# Patient Record
Sex: Female | Born: 1970
Health system: Midwestern US, Community
[De-identification: ages and names within clinical notes are randomized; demographics above are authoritative.]

## PROBLEM LIST (undated history)

## (undated) DIAGNOSIS — M545 Low back pain, unspecified: Secondary | ICD-10-CM

## (undated) DIAGNOSIS — Z1231 Encounter for screening mammogram for malignant neoplasm of breast: Secondary | ICD-10-CM

## (undated) DIAGNOSIS — E049 Nontoxic goiter, unspecified: Secondary | ICD-10-CM

## (undated) DIAGNOSIS — K449 Diaphragmatic hernia without obstruction or gangrene: Secondary | ICD-10-CM

## (undated) DIAGNOSIS — G2581 Restless legs syndrome: Secondary | ICD-10-CM

## (undated) DIAGNOSIS — K5190 Ulcerative colitis, unspecified, without complications: Secondary | ICD-10-CM

## (undated) DIAGNOSIS — D649 Anemia, unspecified: Secondary | ICD-10-CM

## (undated) DIAGNOSIS — G4733 Obstructive sleep apnea (adult) (pediatric): Secondary | ICD-10-CM

## (undated) DIAGNOSIS — K219 Gastro-esophageal reflux disease without esophagitis: Secondary | ICD-10-CM

## (undated) DIAGNOSIS — J45909 Unspecified asthma, uncomplicated: Secondary | ICD-10-CM

## (undated) HISTORY — DX: Restless legs syndrome: G25.81

## (undated) HISTORY — DX: Ulcerative colitis, unspecified, without complications: K51.90

## (undated) HISTORY — DX: Diaphragmatic hernia without obstruction or gangrene: K44.9

## (undated) HISTORY — DX: Anemia, unspecified: D64.9

## (undated) HISTORY — DX: Gastro-esophageal reflux disease without esophagitis: K21.9

## (undated) HISTORY — DX: Unspecified asthma, uncomplicated: J45.909

## (undated) HISTORY — DX: Obstructive sleep apnea (adult) (pediatric): G47.33

---

## 1995-06-11 HISTORY — PX: TOTAL COLECTOMY: SHX852

## 1995-06-11 HISTORY — PX: ILEOSTOMY CLOSURE: SHX1784

## 2015-01-20 NOTE — Progress Notes (Signed)
Formatting of this note might be different from the original.  Returns doing well. Very pleased with results following examination under anesthesia dilatation of anal sphincters. Bowels moving regularly for her which is about 5 or 6 times per 24 hours. No significant pain. Examination anal canal all bit stenotic but seemingly with adequate outlet. Follow up with [place] as needed.  Electronically signed by Golda Acre, MD at 01/20/2015  7:25 PM EDT

## 2015-07-14 ENCOUNTER — Other Ambulatory Visit: Payer: Self-pay | Admitting: Family Medicine

## 2015-07-14 DIAGNOSIS — R1013 Epigastric pain: Secondary | ICD-10-CM

## 2015-07-24 ENCOUNTER — Ambulatory Visit
Admission: RE | Admit: 2015-07-24 | Discharge: 2015-07-24 | Disposition: A | Discharge status: Home / Self Care | Payer: BLUE CROSS/BLUE SHIELD | Source: Ambulatory Visit | Attending: Family Medicine | Admitting: Family Medicine

## 2015-07-24 DIAGNOSIS — R1013 Epigastric pain: Secondary | ICD-10-CM

## 2015-09-01 ENCOUNTER — Other Ambulatory Visit (HOSPITAL_COMMUNITY): Payer: Self-pay | Admitting: Physician Assistant

## 2015-09-01 DIAGNOSIS — R1011 Right upper quadrant pain: Secondary | ICD-10-CM

## 2015-09-01 DIAGNOSIS — K828 Other specified diseases of gallbladder: Secondary | ICD-10-CM

## 2015-09-07 ENCOUNTER — Ambulatory Visit (HOSPITAL_COMMUNITY)
Admission: RE | Admit: 2015-09-07 | Discharge: 2015-09-07 | Disposition: A | Discharge status: Home / Self Care | Payer: BLUE CROSS/BLUE SHIELD | Source: Ambulatory Visit | Attending: Physician Assistant | Admitting: Physician Assistant

## 2015-09-07 DIAGNOSIS — K828 Other specified diseases of gallbladder: Secondary | ICD-10-CM | POA: Diagnosis not present

## 2015-09-07 DIAGNOSIS — R1011 Right upper quadrant pain: Secondary | ICD-10-CM | POA: Diagnosis not present

## 2015-09-07 MED ORDER — TECHNETIUM TC 99M MEBROFENIN IV KIT
5.0000 | PACK | Freq: Once | INTRAVENOUS | Status: AC | PRN
  Administered 2015-09-07: 5 via INTRAVENOUS

## 2016-12-23 IMAGING — US US ABDOMEN COMPLETE
1 series · 14 of 25 positions shown · non-contrast
Comparison: None.

CLINICAL DATA: Abdominal pain

EXAM:
ABDOMEN ULTRASOUND COMPLETE

[Series 1: us abdomen complete · 0.28mm/px · 14 of 78 slices shown]
[im 1/78]
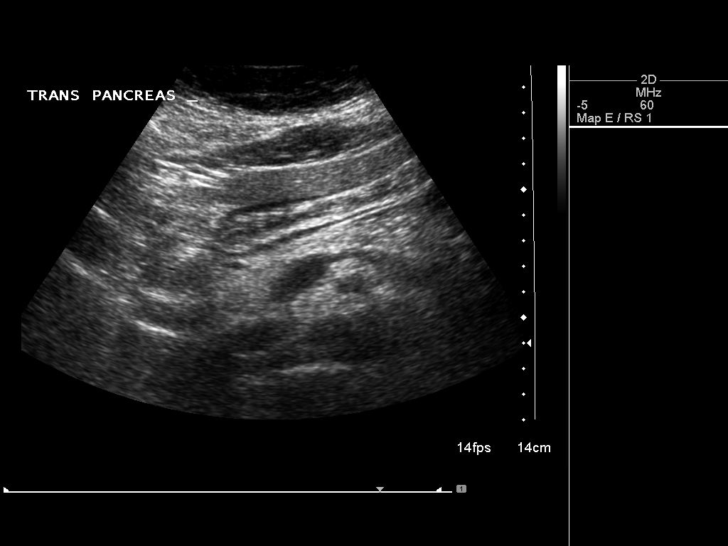
[im 7/78]
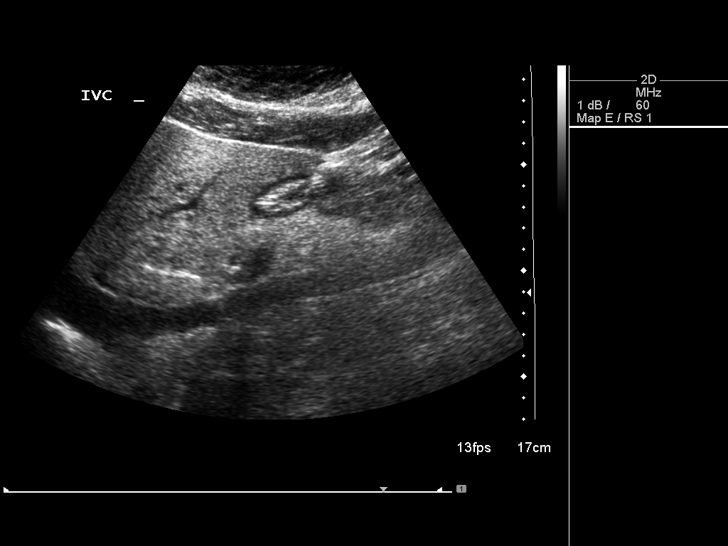
[im 13/78]
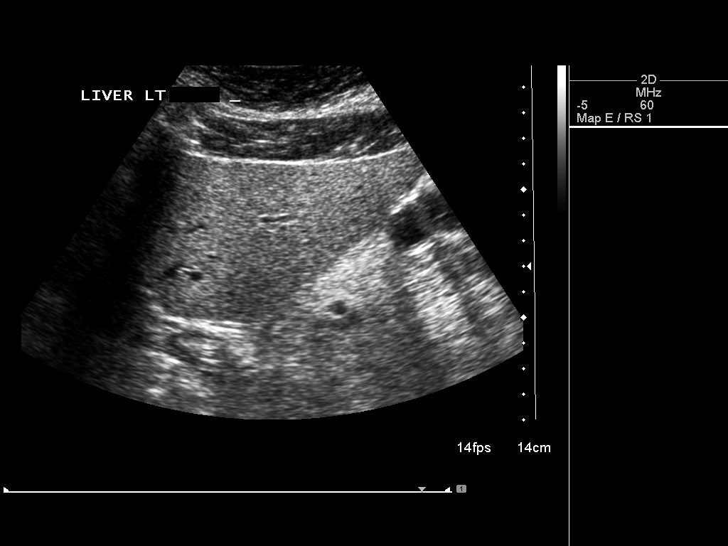
[im 20/78]
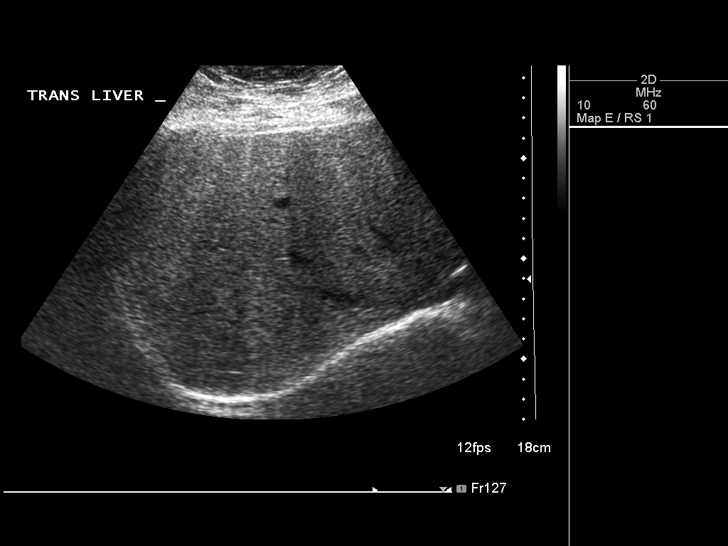
[im 26/78]
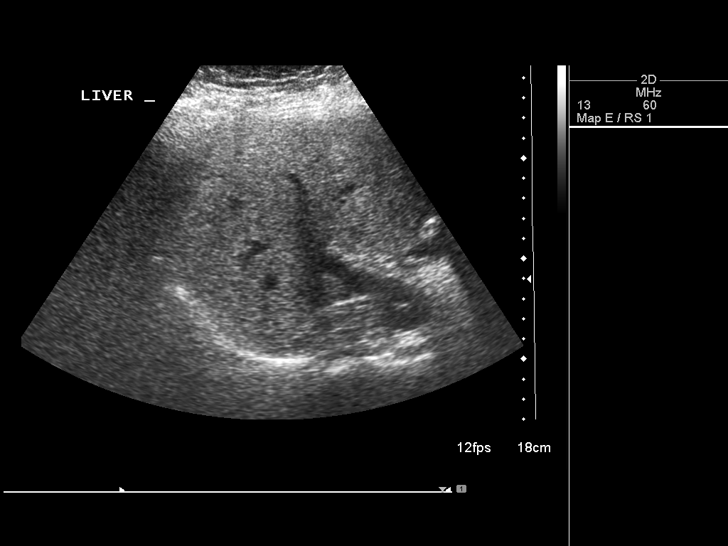
[im 29/78]
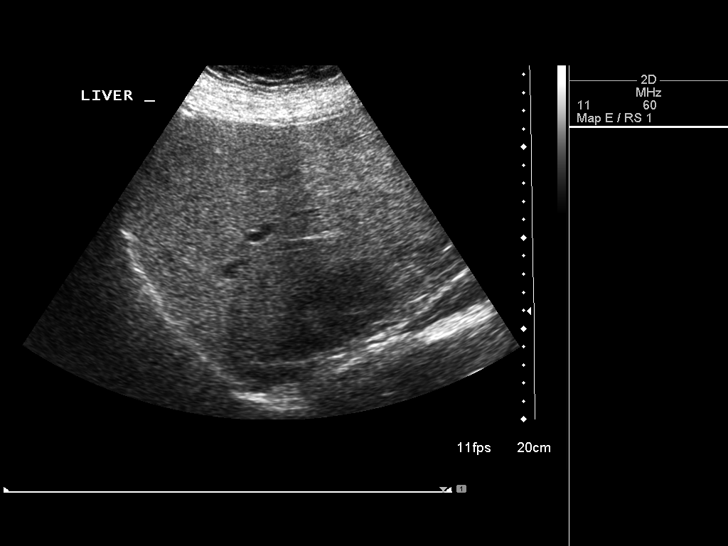
[im 36/78]
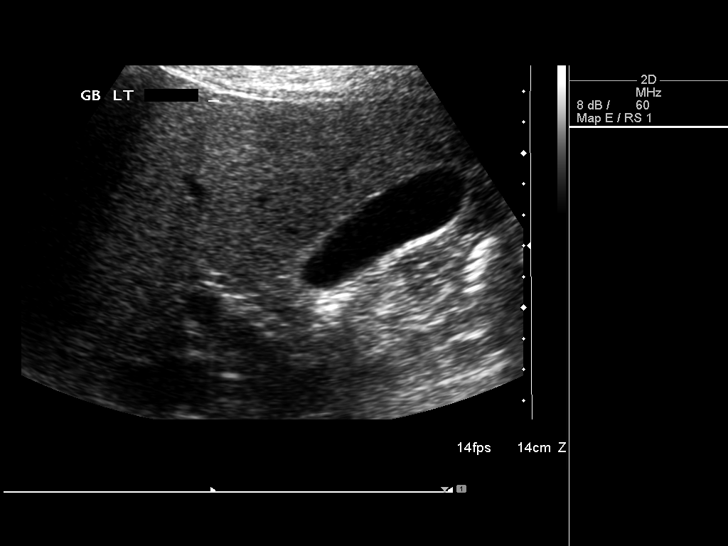
[im 42/78]
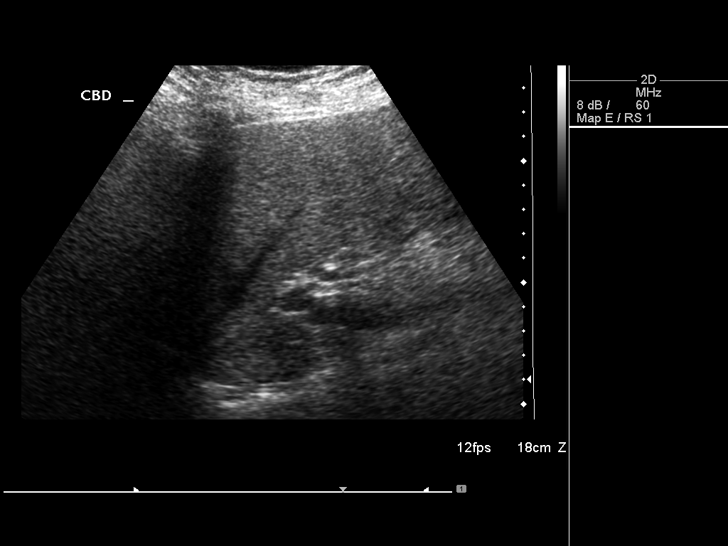
[im 49/78]
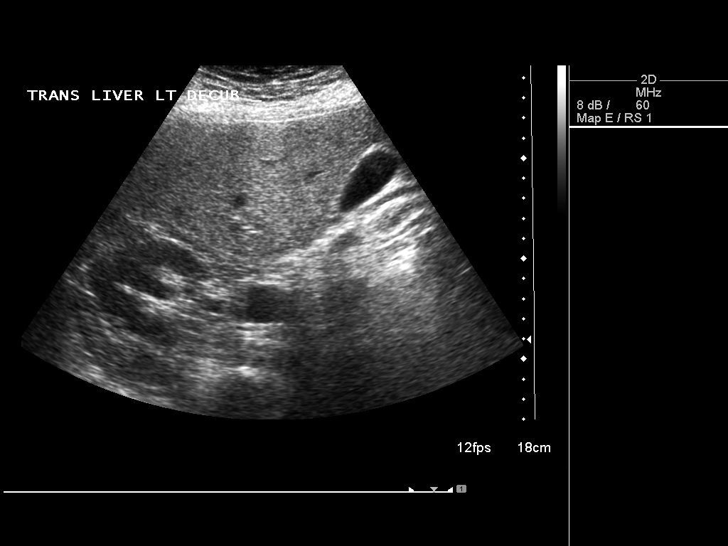
[im 52/78]
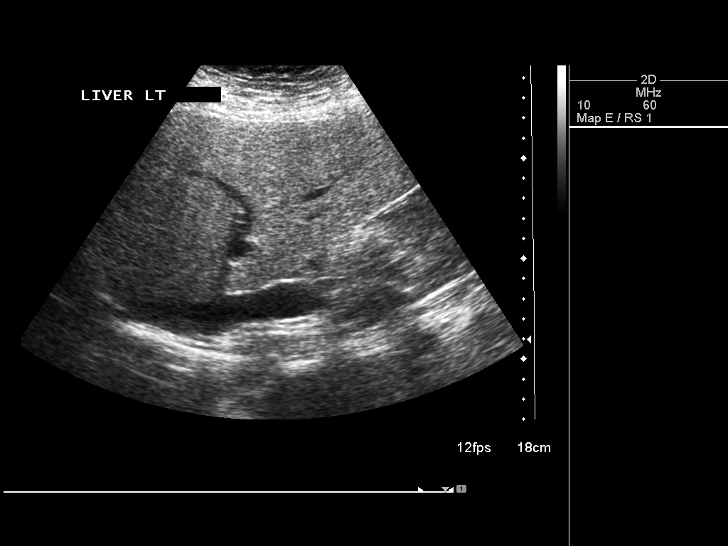
[im 58/78]
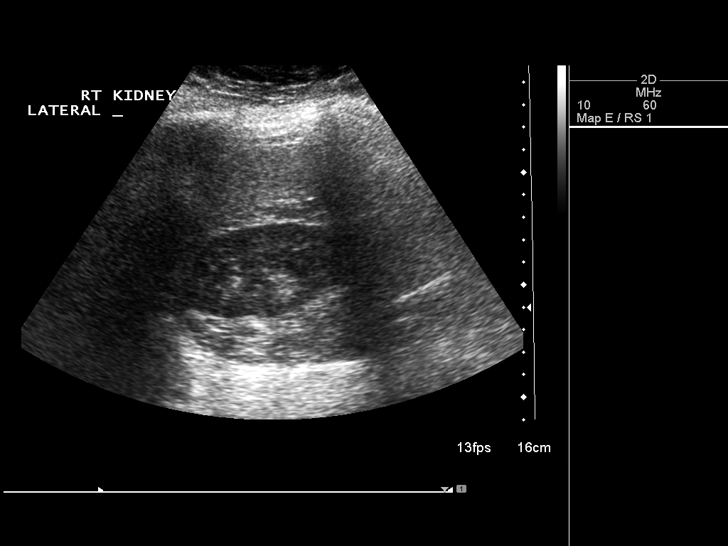
[im 65/78]
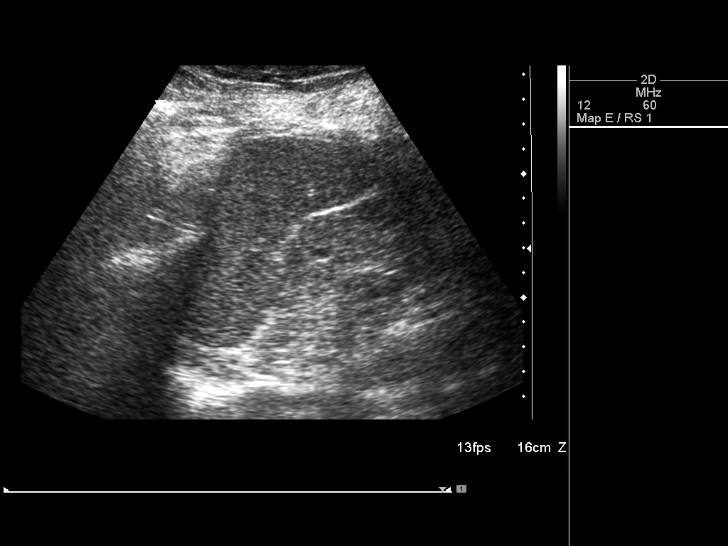
[im 71/78]
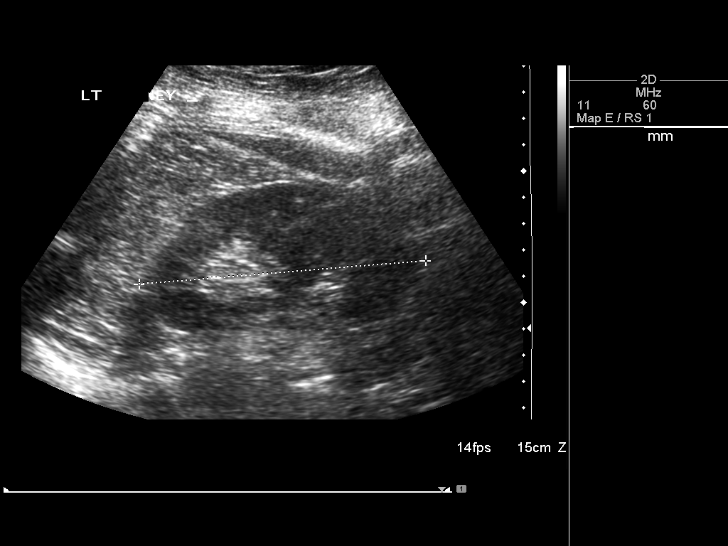
[im 78/78]
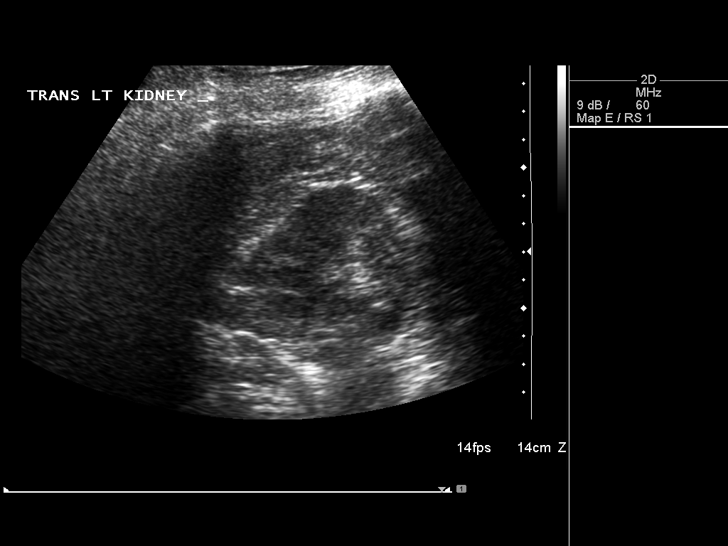

[14 of 25 positions shown; findings below may reference images not displayed]

FINDINGS: Gallbladder: No gallstones or wall thickening visualized. No
sonographic Murphy sign noted by sonographer.

Common bile duct: Diameter: 4.2 mm.

Liver: Slight increased echogenicity consistent with fatty
infiltration. No focal lesion is noted.

IVC: No abnormality visualized.

Pancreas: Visualized portion unremarkable.

Spleen: Size and appearance within normal limits.

Right Kidney: Length: 11.5 cm.. Echogenicity within normal limits.
No mass or hydronephrosis visualized.

Left Kidney: Length: 11.8 cm.. Echogenicity within normal limits. No
mass or hydronephrosis visualized.

Abdominal aorta: No aneurysm visualized.

Other findings: None.
IMPRESSION: Fatty liver.

No acute abnormality is noted.

## 2017-02-06 IMAGING — NM NM HEPATO W/GB/PHARM/[PERSON_NAME]
1 series · 6 of 6 positions shown · non-contrast
Comparison: None.

CLINICAL DATA: Upper abdominal pain with nausea and vomiting

EXAM:
NUCLEAR MEDICINE HEPATOBILIARY IMAGING WITH GALLBLADDER EF
Views: Anterior, right lateral right upper quadrant
RADIOPHARMACEUTICALS:  5.1 mCi Pc-UUm  Choletec IV

[gb ef · 3.43mm/px · 6 of 60 frames shown]
[frame 6/60]
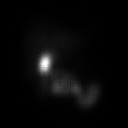
[frame 16/60]
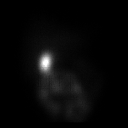
[frame 26/60]
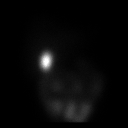
[frame 36/60]
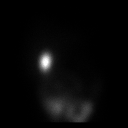
[frame 46/60]
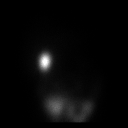
[frame 56/60]
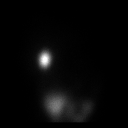

[6 of 6 positions shown; findings below may reference images not displayed]

FINDINGS: Liver uptake of radiotracer is unremarkable. There is prompt
visualization of gallbladder and small bowel, indicating patency of
the cystic and common bile ducts. The patient consumed 8 ounces of
Ensure Complete orally with calculation of the computer generated
ejection fraction of radiotracer from the gallbladder. The patient
experienced moderate abdominal discomfort with the oral Ensure
consumption. The computer generated ejection fraction of radiotracer
from the gallbladder is abnormally low at 19%, normal greater than
33%.
IMPRESSION: Abnormally low ejection fraction of radiotracer from the
gallbladder. This finding is concerning for biliary dyskinesia.
Patient did experience moderate discomfort with the oral Ensure
consumption. Cystic and common bile ducts are patent as is evidenced
by visualization of gallbladder and small bowel.

## 2017-10-13 ENCOUNTER — Encounter: Payer: Self-pay | Admitting: Neurology

## 2017-10-13 ENCOUNTER — Ambulatory Visit: Payer: BLUE CROSS/BLUE SHIELD | Admitting: Neurology

## 2017-10-13 VITALS — BP 118/82 | HR 88 | Ht 63.0 in | Wt 221.0 lb

## 2017-10-13 DIAGNOSIS — G4719 Other hypersomnia: Secondary | ICD-10-CM | POA: Diagnosis not present

## 2017-10-13 DIAGNOSIS — G4761 Periodic limb movement disorder: Secondary | ICD-10-CM | POA: Diagnosis not present

## 2017-10-13 DIAGNOSIS — R0683 Snoring: Secondary | ICD-10-CM

## 2017-10-13 DIAGNOSIS — Z6838 Body mass index (BMI) 38.0-38.9, adult: Secondary | ICD-10-CM | POA: Diagnosis not present

## 2017-10-13 DIAGNOSIS — D501 Sideropenic dysphagia: Secondary | ICD-10-CM | POA: Diagnosis not present

## 2017-10-13 DIAGNOSIS — Z862 Personal history of diseases of the blood and blood-forming organs and certain disorders involving the immune mechanism: Secondary | ICD-10-CM | POA: Diagnosis not present

## 2017-10-13 DIAGNOSIS — G2581 Restless legs syndrome: Secondary | ICD-10-CM | POA: Diagnosis not present

## 2017-10-13 NOTE — Patient Instructions (Signed)
Hypersomnia Hypersomnia is when you feel extremely tired during the day even though you're getting plenty of sleep at night. You may need to take naps during the day, and you may also be extremely difficult to wake up when you are sleeping. What are the causes? The cause of your hypersomnia may not be known. Hypersomnia may be caused by:  Medicines.  Sleep disorders, such as narcolepsy.  Trauma or injury to your head or nervous system.  Using drugs or alcohol.  Tumors.  Medical conditions, such as depression or hypothyroidism.  Genetics.  What are the signs or symptoms? The main symptoms of hypersomnia include:  Feeling extremely tired throughout the day.  Being very difficult to wake up.  Sleeping for longer and longer periods.  Taking naps throughout the day.  Other symptoms may include:  Feeling: ? Restless. ? Annoyed. ? Anxious. ? Low energy.  Having difficulty: ? Remembering. ? Speaking. ? Thinking.  Losing your appetite.  Experiencing hallucinations.  How is this diagnosed? Hypersomnia may be diagnosed by:  Medical history and physical exam. This will include a sleep history.  Completing sleep logs.  Tests may also be done, such as: ? Polysomnography. ? Multiple sleep latency test (MSLT).  How is this treated? There is no cure for hypersomnia, but treatment can be very effective in helping manage the condition. Treatment may include:  Lifestyle and sleeping strategies to help cope with the condition.  Stimulant medicines.  Treating any underlying causes of hypersomnia.  Follow these instructions at home:  Take medicines only as directed by your health care provider.  Schedule short naps for when you feel sleepiest during the day. Tell your employer or teachers that you have hypersomnia. You may be able to adjust your schedule to include time for naps.  Avoid drinking alcohol or caffeinated beverages.  Do not eat a heavy meal before  bedtime. Eat at about the same times every day.  Do not drive or operate heavy machinery if you are sleepy.  Do not swim or go out on the water without a life jacket.  If possible, adjust your schedule so that you do not have to work or be active at night.  Keep all follow-up visits as directed by your health care provider. This is important. Contact a health care provider if:  You have new symptoms.  Your symptoms get worse. Get help right away if: You have serious thoughts of hurting yourself or someone else. This information is not intended to replace advice given to you by your health care provider. Make sure you discuss any questions you have with your health care provider. Document Released: 05/17/2002 Document Revised: 11/02/2015 Document Reviewed: 12/30/2013 Elsevier Interactive Patient Education  2018 Elsevier [employer]. Restless Legs Syndrome Restless legs syndrome is a condition that causes uncomfortable feelings or sensations in the legs, especially while sitting or lying down. The sensations usually cause an overwhelming urge to move the legs. The arms can also sometimes be affected. The condition can range from mild to severe. The symptoms often interfere with a person's ability to sleep. What are the causes? The cause of this condition is not known. What increases the risk? This condition is more likely to develop in:  People who are older than age 37.  Pregnant women. In general, restless legs syndrome is more common in women than in men.  People who have a family history of the condition.  People who have certain medical conditions, such as iron deficiency, kidney  disease, Parkinson disease, or nerve damage.  People who take certain medicines, such as medicines for high blood pressure, nausea, colds, allergies, depression, and some heart conditions.  What are the signs or symptoms? The main symptom of this condition is uncomfortable sensations in the legs. These  sensations may be:  Described as pulling, tingling, prickling, throbbing, crawling, or burning.  Worse while you are sitting or lying down.  Worse during periods of rest or inactivity.  Worse at night, often interfering with your sleep.  Accompanied by a very strong urge to move your legs.  Temporarily relieved by movement of your legs.  The sensations usually affect both sides of the body. The arms can also be affected, but this is rare. People who have this condition often have tiredness during the day because of their lack of sleep at night. How is this diagnosed? This condition may be diagnosed based on your description of the symptoms. You may also have tests, including blood tests, to check for other conditions that may lead to your symptoms. In some cases, you may be asked to spend some time in a sleep lab so your sleeping can be monitored. How is this treated? Treatment for this condition is focused on managing the symptoms. Treatment may include:  Self-help and lifestyle changes.  Medicines.  Follow these instructions at home:  Take medicines only as directed by your health care provider.  Try these methods to get temporary relief from the uncomfortable sensations: ? Massage your legs. ? Walk or stretch. ? Take a cold or hot bath.  Practice good sleep habits. For example, go to bed and get up at the same time every day.  Exercise regularly.  Practice ways of relaxing, such as yoga or meditation.  Avoid caffeine and alcohol.  Do not use any tobacco products, including cigarettes, chewing tobacco, or electronic cigarettes. If you need help quitting, ask your health care provider.  Keep all follow-up visits as directed by your health care provider. This is important. Contact a health care provider if: Your symptoms do not improve with treatment, or they get worse. This information is not intended to replace advice given to you by your health care provider. Make  sure you discuss any questions you have with your health care provider. Document Released: 05/17/2002 Document Revised: 11/02/2015 Document Reviewed: 05/23/2014 Elsevier Interactive Patient Education  [employer].

## 2017-10-13 NOTE — Progress Notes (Signed)
SLEEP MEDICINE CLINIC   Provider:  Melvyn Novas, M D  Primary Care Physician:  Dois Davenport, MD   Referring Provider:    Chief Complaint  Patient presents with  . New Patient (Initial Visit)    pt alone, rm 10. pt has been having the restless leg for about a year and her MD has started treatment with Lyrica. she states that she was married in 2015 and her husband noticed right away that she snored. she wakes up frequently. she tosses and turns in her sleep nightly, and wakes up nightly to void. denies apnea episodes.     HPI:  Cassandra Osborn is a 47 y.o. female , seen here as in a referral for a sleep evaluation, dr Hal Hope advised that the patient snores thunderously and has RLS symptoms that have decreased in intensity on Lyrica.  Chief complaint according to patient :[name] describes that her husband has noted her to continuously move incessantly and she describes an irresistible urge to move.  If she does not of pain in her thighs makes going to sleep very uncomfortable for her.  This irresistible urge to move is already present when she watches TV in the evening and sits still.  In other words it is not possible for her to sit still. [name] here in 05/2013 and since has gained about 45 pounds.  This certainly has contributed to her snoring.  She is suspected to have apnea.  She has tosses and turns in her sleep every night and has to go to the bathroom frequently.  Up to 1-3 nocturia once per night. She has to go a lot not to urinate but defecate s due to colectomy 1997. Acute ulcerative colitis. Has a J- pouch. Heavy periods with anemia.   Sleep habits are as follows:  She has established a routine, winds down , switches the TV off, dims the lights, takes her medication at 8 PM. White noise machine, earphones for easy listening, dark and cool in hr bedroom.she sleeps in a different room. He snores, too. She will be in bad by 9 Pm. She feels relaxed and than the  RLS starts. She wakes up after 2 h of sleep. She uses a fit bit and this may be thrown off piste by her frequent movements. Uses a cooling pillow- she sweats and feels always hot.  She still has menstrual periods, heavy.    Sleep medical history and family sleep history: RLS started only about 1 year ago, long after ulcerative colitis was diagnosed. He had shingles 07-2016 which she felt jump starting it. She had mono as a child, now entering peri - menopause.  Asthma - seasonal allergy triggers.  Obesity - weight gain since move to [place] and since being married. Anemia.  parents insomnia, brother with OSA,   Social history: married in 2015, moved here form [place]. Non smoker, non drinker. No children , one dog, no other pets.  No shift work history.  Husband produced the 11 o clock news.   Review of Systems: Out of a complete 14 system review, the patient complains of only the following symptoms, and all other reviewed systems are negative.  EDS- Snoring, weight ain, sweating, palpitation, allergic rhinitis, asthma. RLS. Chronic soft stools.   Epworth score 14/ 24  , Fatigue severity score 51   , depression score n/a   Social History   Socioeconomic History  . Marital status: Married    Spouse name: Not on file  .  Number of children: Not on file  . Years of education: Not on file  . Highest education level: Not on file  Occupational History  . Not on file  Social Needs  . Financial resource strain: Not on file  . Food insecurity:    Worry: Not on file    Inability: Not on file  . Transportation needs:    Medical: Not on file    Non-medical: Not on file  Tobacco Use  . Smoking status: Never Smoker  . Smokeless tobacco: Never Used  Substance and Sexual Activity  . Alcohol use: Yes    Comment: occasional  . Drug use: Never  . Sexual activity: Not on file  Lifestyle  . Physical activity:    Days per week: Not on file    Minutes per session: Not on file  . Stress: Not on file    Relationships  . Social connections:    Talks on phone: Not on file    Gets together: Not on file    Attends religious service: Not on file    Active member of club or organization: Not on file    Attends meetings of clubs or organizations: Not on file    Relationship status: Not on file  . Intimate partner violence:    Fear of current or ex partner: Not on file    Emotionally abused: Not on file    Physically abused: Not on file    Forced sexual activity: Not on file  Other Topics Concern  . Not on file  Social History Narrative  . Not on file    Family History  Problem Relation Age of Onset  . Colon cancer Mother   . Heart disease Father   . Heart attack Father   . Diabetes Brother     Past Medical History:  Diagnosis Date  . Anemia   . Asthma   . Diaphragmatic hernia without obstruction or gangrene   . GERD (gastroesophageal reflux disease)   . OSA (obstructive sleep apnea)   . Restless legs syndrome (RLS)   . Ulcerative colitis (HCC)       Current Outpatient Medications  Medication Sig Dispense Refill  . Esomeprazole Magnesium 5 MG PACK Take by mouth.    . fluticasone (FLONASE) 50 MCG/ACT nasal spray fluticasone propionate 50 mcg/actuation nasal spray,suspension    . iron polysaccharides (NU-IRON) 150 MG capsule Take 150 mg by mouth daily.    . Levocetirizine Dihydrochloride (XYZAL PO) Take 1 tablet by mouth daily.    . montelukast (SINGULAIR) 10 MG tablet Take 10 mg by mouth every evening.  11  . Pregabalin (LYRICA CR PO) Take 1 tablet by mouth daily.    . sucralfate (CARAFATE) 1 g tablet Take 1 g by mouth daily.     No current facility-administered medications for this visit.     Allergies as of 10/13/2017 - Review Complete 10/13/2017  Allergen Reaction Noted  . Codeine Hives and Itching 07/26/2014    Vitals: BP 118/82   Pulse 88   Ht  (1.6 m)   Wt 221 lb (100.2 kg)   BMI 39.15 kg/m  Last Weight:  Wt Readings from Last 1 Encounters:   10/13/17 221 lb (100.2 kg)   ZOX:WRUE mass index is 39.15 kg/m.     Last Height:   Ht Readings from Last 1 Encounters:  10/13/17  (1.6 m)    Physical exam:  General: The patient is awake, alert and appears not in  acute distress. The patient is well groomed. Head: Normocephalic, atraumatic. Neck is supple. Mallampati 2- large tonsils, lateral restriction.   neck circumference:16. Nasal airflow patent ,  Retrognathia is not seen. Wore braces at age 14-15 , later retainer. Bruxism.  Cardiovascular:  Regular rate and rhythm , without  murmurs or carotid bruit, and without distended neck veins. Respiratory: Lungs are clear to auscultation. Skin:  Without evidence of edema, or rash Trunk: BMI is 39.1. The patient's posture is erect   Neurologic exam : The patient is awake and alert, oriented to place and time.  Attention span & concentration ability appears normal.  Speech is fluent,  without  dysarthria, dysphonia or aphasia. Mood and affect are appropriate. Cranial nerves: Pupils are equal and briskly reactive to light. Funduscopic exam without evidence of pallor or edema.  Extraocular movements  in vertical and horizontal planes intact and without nystagmus. Visual fields by finger perimetry are intact. Hearing to finger rub intact.  Facial sensation intact to fine touch. Facial motor strength is symmetric and tongue and uvula move midline. Shoulder shrug was symmetrical.  Motor exam:  Normal tone, muscle bulk and symmetric strength in all extremities. Sensory:  Fine touch, pinprick and vibration were tested in all extremities. Proprioception tested in the upper extremities was normal. Coordination: Rapid alternating movements in the fingers/hands was normal. Finger-to-nose maneuver  normal without evidence of ataxia, dysmetria or tremor. Gait and station: Patient walks without assistive device and is able unassisted to climb up to the exam table.  Strength within normal limits.Stance  is stable and normal. Tandem gait deferred. Turns with 3  Steps. Romberg testing is negative. Deep tendon reflexes: in the  upper and lower extremities are symmetric and intact. Babinski maneuver response is downgoing.    Assessment:  After physical and neurologic examination, review of laboratory studies,  Personal review of imaging studies, reports of other /same  Imaging studies, results of polysomnography and / or neurophysiology testing and pre-existing records as far as provided in visit., my assessment is   1)  Excessive daytime sleepiness-  Snoring, PLMs and sleep fragmentation. Craves a nap every day, naps may last a couple of hours.   2) RLS- on Lyrica. Improved RLS some. Noted some dizziness. left foot lift is weaker than right. She reports feeling off balance when she descending stairs.   3) Malabsortion ?  Anemia for many years . Lives without colon, Has a J pouch, check for iron and b 12 deficiency. Ferritin , TIBC.  4) GERD- may mimick sometimes apnea.    The patient was advised of the nature of the diagnosed disorder , the treatment options and the  risks for general health and wellness arising from not treating the condition.   I spent more than 45  minutes of face to face time with the patient.  Greater than 50% of time was spent in counseling and coordination of care. We have discussed the diagnosis and differential and I answered the patient's questions.    Plan:  Treatment plan and additional workup :  I will invite Mrs. Chiou for a multitest approach, I would definitely need to look into the recent origin of restless legs and see if it correlates with low iron levels, given that she has a history of long-standing anemia, her restless legs are rather recent and may have exacerbated with an heavy menstrual.  I also will order a split-night polysomnography her thumb to her snoring makes it more likely that she has  obstructive sleep apnea.  Her excessive daytime sleepiness  is easily correlated to the fragmented sleep she reports but I do not know which of the sleep interruptions are periodic limb movement related versus snoring, versus perhaps hypoxemia promoted by apnea or anemia.    Melvyn Novas, MD 10/13/2017, 10:41 AM  Certified in Neurology by ABPN Certified in Sleep Medicine by [facility] [address], [address] [place], [place] [address]

## 2017-10-14 ENCOUNTER — Telehealth: Payer: Self-pay

## 2017-10-14 DIAGNOSIS — R0683 Snoring: Secondary | ICD-10-CM

## 2017-10-14 DIAGNOSIS — G2581 Restless legs syndrome: Secondary | ICD-10-CM

## 2017-10-14 LAB — IRON AND TIBC
IRON: 19 ug/dL — AB (ref 27–159)
Iron Saturation: 5 % — CL (ref 15–55)
Total Iron Binding Capacity: 403 ug/dL (ref 250–450)
UIBC: 384 ug/dL (ref 131–425)

## 2017-10-14 LAB — VITAMIN B12: VITAMIN B 12: 937 pg/mL (ref 232–1245)

## 2017-10-14 LAB — FERRITIN: Ferritin: 13 ng/mL — ABNORMAL LOW (ref 15–150)

## 2017-10-14 NOTE — Telephone Encounter (Signed)
[employer] denied in lab sleep study, need HST order 

## 2017-10-14 NOTE — Telephone Encounter (Signed)
Home sleep test ordered 

## 2017-10-16 ENCOUNTER — Telehealth: Payer: Self-pay | Admitting: Neurology

## 2017-10-16 NOTE — Telephone Encounter (Signed)
-----   Message from Melvyn Novas, MD sent at 10/15/2017  9:11 AM EDT ----- Very low ferritin ( we like 30 plus) , iron level and very low iron saturation- this may be the explanation. Start oral iron with Orange juice for better absorption. Prenatal vitamin with Iron can be better tolerated. And we offer a start IV iron to get her iron store up. Please contact TINA RN with intrafusion.

## 2017-10-16 NOTE — Telephone Encounter (Signed)
Called the pt and informed her of the lab work. I informed her of the low iron and ferritin levels. Dr Dohmeier recommendation is to start taking over the counter iron supplement and also see if we can get her approved with a Iron infusion. I have discussed this with our infusion lab and they will look into getting approval for the pt and should reach out within the next couple weeks. I have instructed the pt if within the month she had not heard anything then for her to call and check on the status. Pt verbalized understanding. Pt had no questions at this time but was encouraged to call back if questions arise.

## 2017-10-20 ENCOUNTER — Ambulatory Visit (INDEPENDENT_AMBULATORY_CARE_PROVIDER_SITE_OTHER): Payer: BLUE CROSS/BLUE SHIELD | Admitting: Neurology

## 2017-10-20 DIAGNOSIS — G4733 Obstructive sleep apnea (adult) (pediatric): Secondary | ICD-10-CM

## 2017-10-20 DIAGNOSIS — R0683 Snoring: Secondary | ICD-10-CM

## 2017-10-20 DIAGNOSIS — D510 Vitamin B12 deficiency anemia due to intrinsic factor deficiency: Secondary | ICD-10-CM

## 2017-10-20 DIAGNOSIS — G2581 Restless legs syndrome: Secondary | ICD-10-CM

## 2017-10-20 DIAGNOSIS — G4761 Periodic limb movement disorder: Secondary | ICD-10-CM

## 2017-10-24 DIAGNOSIS — G4733 Obstructive sleep apnea (adult) (pediatric): Secondary | ICD-10-CM | POA: Insufficient documentation

## 2017-10-24 NOTE — Procedures (Signed)
[address] [place], [place] [address] NAME: Cassandra Osborn                                                                 DOB: 1971/05/03 MEDICAL RECORD NO:  [id number]                                         DOS:  10/20/2017 REFERRING PHYSICIAN: Nadyne Coombes, M.D. STUDY PERFORMED: Home Sleep Study on Apnea Link  HISTORY: Cassandra Osborn is a 47 y.o. female patient, who reports snoring thunderously and has RLS symptoms (these have decreased on Lyrica). Cassandra Osborn reports that her husband has noted her to move incessantly and she experiences an irresistible urge to move when at rest.  If she does not move she feels a pain in her thighs that makes going to sleep very uncomfortable for her.  Cassandra Osborn moved to [facility] in 05/2013 and since has gained about 45 pounds.  This certainly has contributed to her snoring.  She is suspected to have apnea. Heavy periods resulted in anemia.  She tosses and turns in her sleep every night and has to go to the bathroom frequently, 1-3 per night. She has to defecate (not urinate) frequently as a result of a colectomy 1997 with J pouch, which treated an acute ulcerative colitis. Epworth score endorsed at 14/ 24 points, Fatigue severity score 51 points   BMI: 39.5  STUDY RESULTS:  Total Recording Time: 8 hours 19 minutes; valid test time: 6h and 55 minutes.  Total Apnea/Hypopnea Index (AHI): 24.6 /h, RDI:  27.8 /h. Average Oxygen Saturation: 92%; Lowest Oxygen Saturation: 80 %  Total Time in Oxygen Saturation below 89 %: 14 minutes  Average Heart Rate:  66 bpm (between 50 and 96 bpm) IMPRESSION: Mild- moderate obstructive sleep apnea is noted, not associated with clinically significant hypoxemia or Tachy-Bradycardia.  Caveat: This HST cannot record PLMs or movement, and not reliably report positional changes, nor sleep architecture.  RECOMMENDATION: First step is to treat sleep apnea and re-evaluate after 60-90 days on CPAP for improvement in Epworth and Fatigue  scores, observation as to changes in restlessness/ limb movements.     I certify that I have reviewed the raw data recording prior to the issuance of this report in accordance with the standards of the American Academy of Sleep Medicine (AASM). Melvyn Novas, M.D.    10-24-2017    Medical Director of [facility], accredited by the AASM. Diplomat of the ABPN and ABSM.

## 2017-10-24 NOTE — Addendum Note (Signed)
Addended by: Melvyn Novas on: 10/24/2017 02:51 PM   Modules accepted: Orders

## 2017-10-27 ENCOUNTER — Telehealth: Payer: Self-pay | Admitting: Neurology

## 2017-10-27 NOTE — Telephone Encounter (Signed)
I called Cassandra Osborn. I advised Cassandra Osborn that Dr. Vickey Huger reviewed their sleep study results and found that Cassandra Osborn has mild to moderate sleep apnea. Dr. Vickey Huger recommends that Cassandra Osborn starts a auto CPAP 6-16 cm H2O pressure. I reviewed PAP compliance expectations with the Cassandra Osborn. Cassandra Osborn is agreeable to starting a CPAP. I advised Cassandra Osborn that an order will be sent to a DME, [facility], and [facility] will call the Cassandra Osborn within about one week after they file with the Cassandra Osborn's insurance. [facility] will show the Cassandra Osborn how to use the machine, fit for masks, and troubleshoot the CPAP if needed. A follow up appt was made for insurance purposes with Dr. Vickey Huger on Feb 04, 2018 at 10:30 am. Cassandra Osborn verbalized understanding to arrive 15 minutes early and bring their CPAP. A letter with all of this information in it will be mailed to the Cassandra Osborn as a reminder. I verified with the Cassandra Osborn that the address we have on file is correct. Cassandra Osborn verbalized understanding of results. Cassandra Osborn had no questions at this time but was encouraged to call back if questions arise.

## 2017-10-27 NOTE — Telephone Encounter (Signed)
-----   Message from Melvyn Novas, MD sent at 10/24/2017  2:50 PM EDT ----- IMPRESSION: Mild- moderate obstructive sleep apnea is noted, not associated with clinically significant hypoxemia or Tachy-Bradycardia.  Caveat: This HST cannot record PLMs or movement, and not reliably report positional changes, nor sleep architecture.   RECOMMENDATION: First step is to treat sleep apnea on CPAP and  re-evaluate after 60-90 days on CPAP therapy  for improvement in Epworth  and Fatigue scores, observation as to changes in restlessness/  limb movements. Parallel testing for TIBC, Ferritin and Iron saturation.   Cc Dr Hal Hope

## 2017-11-17 ENCOUNTER — Institutional Professional Consult (permissible substitution): Payer: Self-pay | Admitting: Neurology

## 2018-02-04 ENCOUNTER — Ambulatory Visit: Payer: BLUE CROSS/BLUE SHIELD | Admitting: Neurology

## 2018-02-04 ENCOUNTER — Encounter: Payer: Self-pay | Admitting: Neurology

## 2018-02-04 VITALS — BP 125/85 | HR 64 | Ht 64.0 in | Wt 209.0 lb

## 2018-02-04 DIAGNOSIS — G4733 Obstructive sleep apnea (adult) (pediatric): Secondary | ICD-10-CM | POA: Insufficient documentation

## 2018-02-04 DIAGNOSIS — Z9989 Dependence on other enabling machines and devices: Secondary | ICD-10-CM

## 2018-02-04 DIAGNOSIS — D501 Sideropenic dysphagia: Secondary | ICD-10-CM

## 2018-02-04 NOTE — Progress Notes (Signed)
S[facility]EP MED[facility]INE CLINIC   Provider:  Larey Seat, M D  Primary Care Physician:  Hayden Rasmussen, MD   Chief Complaint  Patient presents with  . Follow-up    pt alone, rm 10. pt states that DME is [facility], pt states initially she hated the machine but now loves it. she found a mask that works for her.     HPI:  Elleah Hemsley is a 47 y.o. female , seen here as in a referral on 10-13-2017  for a sleep evaluation. Dr. Darron Doom advised that the patient snores thunderously and has RLS symptoms that have decreased in intensity on Lyrica.   RV 02-04-2018 , I have the pleasure of seeing Mrs. Cassandra Osborn today in a revisit.  She has undergone a home sleep test on apnea link on 20 Oct 2017 which confirmed the presence of sleep apnea.  She had endorsed the Epworth sleepiness score at 14 and the fatigue severity score at 51 points.  Her BMI was 39.5 at the time of the test.  Her AHI was 24.6, RDI was 27.8/h, she did have 40 minutes of the oxygen desaturation normal variability in heart rate, the home sleep test could not answer questions as to the sleep architecture or the presence of periodic limb movements but we decided to start treatment with CPAP given the significance of her apnea.    She has now been using the CPAP for over 2 months and over the last 30 days has 100% compliance of daily use, 97% for the last 29 days, average daily daily time of use is 6 hours 21 minutes, AutoSet has a pressure window between 6 cm and 16 cmH2O pressure with 3 cm EPR the 95th percentile pressure that she uses is 10.2 cm water.  She has minimal air leaks, she has a residual AHI of 1.5 the needs to be no further adjustment met as to the headgear, mask or settings of the machine.  She is followed by [facility], DME. Tried 3 masks, was given a FFM ( RETROGNATHIA ! ) that created an air leak with sleeping on the side, tried nasal mask, shifted to nasal cushions work fine, with a chin strap.     Chief complaint according to  patient :Mrs. Prange describes that her husband has noted her to continuously move incessantly and she describes an irresistible urge to move.  If she does not of pain in her thighs makes going to sleep very uncomfortable for her.  This irresistible urge to move is already present when she watches TV in the evening and sits still.  In other words it is not possible for her to sit still. Mrs. Pederson moved here in 05/2013 and since has gained about 45 pounds.  This certainly has contributed to her snoring.  She is suspected to have apnea.  She has tosses and turns in her sleep every night and has to go to the bathroom frequently.  Up to 1-3 nocturia once per night. She has to go a lot not to urinate but defecate s due to colectomy 1997. Acute ulcerative colitis. Has a J- pouch. Heavy periods with anemia.   Sleep habits are as follows:  She has established a routine, winds down , switches the TV off, dims the lights, takes her medication at 8 PM. White noise machine, earphones for easy listening, dark and cool in hr bedroom.she sleeps in a different room. He snores, too. She will be in bad by 9 Pm. She feels  relaxed and than the RLS starts. She wakes up after 2 h of sleep. She uses a fit bit and this may be thrown off piste by her frequent movements. Uses a cooling pillow- she sweats and feels always hot.  She still has menstrual periods, heavy.    Sleep medical history and family sleep history: RLS started only about 1 year ago, long after ulcerative colitis was diagnosed. He had shingles 07-2016 which she felt jump starting it. She had mono as a child, now entering peri - menopause.  Asthma - seasonal allergy triggers.  Obesity - weight gain since move to [place] and since being married. Anemia.  parents insomnia, brother with OSA,   Social history: married in 2015, moved here form [place]. Non smoker, non drinker. No children , one dog, no other pets.  No shift work history.  Husband produced the [employer] o [employer]  news.   Review of Systems: Out of a complete 14 system review, the patient complains of only the following symptoms, and all other reviewed systems are negative.  EDS- Snoring, weight ain, sweating, palpitation, allergic rhinitis, asthma. RLS. Chronic soft stools. She is able to sleep through the night, 9-9.30 Pm bedtime- one nocturnal defecation. She will sometimes between 2-3 AM take the CPAP off when she feels hot. She has been rid of her cough, of allergies since she uses CPAP.  No longer hart palpitations, she is alert, her family " likes her " better.     Epworth score now 4 down from 14/ 24 pre CPAP   , Fatigue severity score  Is now on CPAP 24 down from 51/ 63   , depression score ' much better   Social History   Socioeconomic History  . Marital status: Married    Spouse name: Not on file  . Number of children: Not on file  . Years of education: Not on file  . Highest education level: Not on file  Occupational History  . Not on file  Social Needs  . Financial resource strain: Not on file  . Food insecurity:    Worry: Not on file    Inability: Not on file  . Transportation needs:    Medical: Not on file    Non-medical: Not on file  Tobacco Use  . Smoking status: Never Smoker  . Smokeless tobacco: Never Used  Substance and Sexual Activity  . Alcohol use: Yes    Comment: occasional  . Drug use: Never  . Sexual activity: Not on file  Lifestyle  . Physical activity:    Days per week: Not on file    Minutes per session: Not on file  . Stress: Not on file  Relationships  . Social connections:    Talks on phone: Not on file    Gets together: Not on file    Attends religious service: Not on file    Active member of club or organization: Not on file    Attends meetings of clubs or organizations: Not on file    Relationship status: Not on file  . Intimate partner violence:    Fear of current or ex partner: Not on file    Emotionally abused: Not on file    Physically  abused: Not on file    Forced sexual activity: Not on file  Other Topics Concern  . Not on file  Social History Narrative  . Not on file    Family History  Problem Relation Age of Onset  . Colon  cancer Mother   . Heart disease Father   . Heart attack Father   . Diabetes Brother     Past Medical History:  Diagnosis Date  . Anemia   . Asthma   . Diaphragmatic hernia without obstruction or gangrene   . GERD (gastroesophageal reflux disease)   . OSA (obstructive sleep apnea)   . Restless legs syndrome (RLS)   . Ulcerative colitis ([place])       Current Outpatient Medications  Medication Sig Dispense Refill  . Esomeprazole Magnesium 5 MG PACK Take by mouth.    . levocetirizine (XYZAL) 5 MG tablet Take 5 mg by mouth every evening.     No current facility-administered medications for this visit.     Allergies as of 02/04/2018 - Review Complete 02/04/2018  Allergen Reaction Noted  . Codeine Hives and Itching 07/26/2014    Vitals: BP 125/85   Pulse 64   Ht 5' 4"  (1.626 m)   Wt 209 lb (94.8 kg)   BMI 35.87 kg/m  Last Weight:  Wt Readings from Last 1 Encounters:  02/04/18 209 lb (94.8 kg)   ZJI:RCVE mass index is 35.87 kg/m.     Last Height:   Ht Readings from Last 1 Encounters:  02/04/18 5' 4"  (1.626 m)    Physical exam:  General: The patient is awake, alert and appears not in acute distress. The patient is well groomed. Head: Normocephalic, atraumatic. Neck is supple. Mallampati 2- large tonsils, lateral restriction.   neck circumference:16. Nasal airflow patent ,  Retrognathia is not seen. Wore braces at age 35-15 , later retainer. Bruxism.  Cardiovascular:  Regular rate and rhythm , without  murmurs or carotid bruit, and without distended neck veins. Respiratory: Lungs are clear to auscultation. Skin:  Without evidence of edema, or rash Trunk: BMI is 39.1. The patient's posture is erect   Neurologic exam : The patient is awake and alert, oriented to place  and time.  Attention span & concentration ability appears normal.  Speech is fluent,  without  dysarthria, dysphonia or aphasia. Mood and affect are appropriate. Cranial nerves: Pupils are equal and briskly reactive to light. Funduscopic exam without evidence of pallor or edema.  Extraocular movements  in vertical and horizontal planes intact and without nystagmus. Visual fields by finger perimetry are intact. Hearing to finger rub intact.  Facial sensation intact to fine touch. Facial motor strength is symmetric and tongue and uvula move midline. Shoulder shrug was symmetrical.  Motor exam:  Normal tone, muscle bulk and symmetric strength in all extremities. Sensory:  Fine touch, pinprick and vibration were tested in all extremities. Proprioception tested in the upper extremities was normal. Coordination: Rapid alternating movements in the fingers/hands was normal. Finger-to-nose maneuver  normal without evidence of ataxia, dysmetria or tremor. Gait and station: Patient walks without assistive device and is able unassisted to climb up to the exam table.  Strength within normal limits.Stance is stable and normal. Tandem gait deferred. Turns with 3  Steps. Romberg testing is negative. Deep tendon reflexes: in the  upper and lower extremities are symmetric and intact. Babinski maneuver response is downgoing.    Assessment:  After physical and neurologic examination, review of laboratory studies,  Personal review of imaging studies, reports of other /same  Imaging studies, results of polysomnography and / or neurophysiology testing and pre-existing records as far as provided in visit., my assessment is   1)  Excessive daytime sleepiness- resolved under CPAP use and very good  compliance   -  no longer Snoring, PLMs and sleep fragmentation. No more naps.   2) RLS- on Lyrica. Improved RLS some. Has a J pouch,  We checked for iron and b 12 deficiency. Ferritin , TIBC.- all low - she has felt much better  since Iv iron infusion.   3) GERD on nexium - no symptoms- but has not tried going off. No history of GI bleed. .   The patient was advised of the nature of the diagnosed disorder , the treatment options and the  risks for general health and wellness arising from not treating the condition.   I spent more than 25  minutes of face to face time with the patient.  Greater than 50% of time was spent in counseling and coordination of care. We have discussed the diagnosis and differential and I answered the patient's questions.    Plan:  Treatment plan and additional workup :  1)continue  CPAP with nasal pillow and prn chin strap. 2) iron recheck test now that she received IV iron for low ferritin, TIBC. RLS are resolved.  3) RV in 12 month.  See Dr. Darron Doom in follow up- this calendar year.   Larey Seat, MD 5/73/2256, 72:09 AM  Certified in Neurology by ABPN Certified in [place] by [facility] [address], [place],  [address]

## 2018-02-04 NOTE — Patient Instructions (Signed)

## 2018-02-05 LAB — IRON,TIBC AND FERRITIN PANEL
FERRITIN: 14 ng/mL — AB (ref 15–150)
IRON SATURATION: 12 % — AB (ref 15–55)
Iron: 48 ug/dL (ref 27–159)
TIBC: 415 ug/dL (ref 250–450)
UIBC: 367 ug/dL (ref 131–425)

## 2018-09-30 ENCOUNTER — Telehealth: Payer: Self-pay | Admitting: Neurology

## 2018-09-30 NOTE — Telephone Encounter (Signed)
Received a email from [employer] march 26th that the patient has been noncompliant with the machine and they were unable to obtain reauthorization through insurance for her machine. The patient appears to be wearing it most nights but not wearing more then 4 hrs a night.   "Unable to obtain reauth due to patient non compliance 37%"

## 2019-02-08 ENCOUNTER — Ambulatory Visit: Payer: BLUE CROSS/BLUE SHIELD | Admitting: Neurology

## 2020-05-17 ENCOUNTER — Ambulatory Visit
Admit: 2020-05-17 | Discharge: 2020-05-17 | Payer: PRIVATE HEALTH INSURANCE | Attending: Physical Medicine & Rehabilitation | Primary: Student in an Organized Health Care Education/Training Program

## 2020-05-17 ENCOUNTER — Encounter
Admit: 2020-05-17 | Discharge: 2020-05-17 | Payer: PRIVATE HEALTH INSURANCE | Primary: Student in an Organized Health Care Education/Training Program

## 2020-05-17 ENCOUNTER — Ambulatory Visit: Attending: Physical Medicine & Rehabilitation

## 2020-05-17 DIAGNOSIS — M545 Low back pain, unspecified: Secondary | ICD-10-CM

## 2020-05-17 NOTE — Progress Notes (Signed)
Progress Notes by Ferrel Logan, MD at 05/17/20 0830                Author: Ferrel Logan, MD  Service: --  Author Type: Physician       Filed: 05/17/20 0924  Encounter Date: 05/17/2020  Status: Signed          Editor: Ferrel Logan, MD (Physician)                          [facility]   Consultation Note      Patient ID:   Name: Lisa Mcdonald   MRN: [id number]   AGE: 49 y.o.   DOB: 20-Jan-1971      Date of Consultation:  May 17, 2020      CC:      Chief Complaint       Patient presents with        ?  Back Pain                HPI: Lisa Mcdonald is a 49 year old female who presents today for evaluation of back pain.  She has  longstanding history of bilateral low back pain without specific trauma.  Has been worse over the last several months.  Chiropractic treatment has not helped.  She switched to a bed that is adjustable, and that helped the back pain some.  However, it  resulted in a left-sided headache, radiating to the left neck.  She had not previously had any neck pain.  The pain does not radiate to the arms or legs.  The neck pain and head ache have improved somewhat since adjusting her bed again.  Now the pain  is just primarily in the central bilateral lower back.  There are no lower semiparesthesias.  The pain is associated with nighttime awakening.  The pain is only present when she is lying down in bed.  It is mildly alleviated when she lies in a tight fetal  position.      She has not had any recent lumbar spine imaging.  MRI cervical spine October 2021 showed degenerative change of the cervical spine without severe central or neuroforaminal narrowing.        Past Medical History Includes: No past medical history on file. , No past surgical history on file.   Family History: No family history on file.     Social History:      Social History          Tobacco Use         ?  Smoking status:  Not on file     ?  Smokeless tobacco:  Not on file       Substance Use  Topics         ?  Alcohol use:  Not on file           ALLERGIES: Not on File       Patient Medications       Current Outpatient Medications        Medication  Sig         ?  cetirizine (ZYRTEC) 10 mg tablet  Take  by mouth.         ?  esomeprazole (NexIUM) 20 mg capsule  1 capsule          No current facility-administered medications for this visit.  ROS/Meds/PSH/PMH/FH/SH: I personally reviewed the patients standard intake form.       Physical Exam:        Lumbar: PE: General: Awake, alert, no distress. HEENT: Mucous membranes moist and pink.Marland Kitchen Psych: Appropriate and conversant. Derm: No rash Gait: Unassisted, non-ataxic MS: Straight leg raise negative  bilaterally.  No pain with hip internal or external rotation.  No pain with resisted hip flexion bilaterally.  She has pain with lumbar flexion or extension.  She has central low back pain with bilateral lumbar facet load.  She has tenderness to palpation  over the lower lumbar spine and paraspinals. Strength is 5/5 and symmetric in the bilateral lower extremities.  No foot drop. Neurological: Sensation to light touch is intact in the bilateral lower extremities. Reflexes are trace and symmetric in the  bilateral lower extremities.        Lumbar spine XR: AP, Lateral views          Clinical Indication       ICD-10-CM  ICD-9-CM        1.  Lumbar pain   M54.50  724.2  XR SPINE LUMB 2 OR 3 V            REFERRAL TO PHYSICAL THERAPY      2.  Lumbar spondylosis   M47.816  721.3  REFERRAL TO PHYSICAL THERAPY      3.  Disc degeneration, lumbar   M51.36  722.52  REFERRAL TO PHYSICAL THERAPY      4.  Neck pain   M54.2  723.1  REFERRAL TO PHYSICAL THERAPY                     Report: AP, lateral x-ray of the Lumbar spine demonstrates lower lumbar DDD, spondylosis, facet arthropathy.  We discussed these findings and reviewed these images together today.      Impression:  lower lumbar DDD, spondylosis, facet arthropathy.           Ferrel Logan, MD          VITALS:  @IPVITALS (8:)@ , @TMAX (24)@              Pain Assessment   05/17/2020        Location of Pain  Neck     Severity of Pain  3     Quality of Pain  Aching     Aggravating Factors  (No Data)     Aggravating Factors Comment  laying down        Relieving Factors  Rest                          Results for orders placed or performed in visit on 05/17/20     XR SPINE LUMB 2 OR 3 V          Narrative       AUTOMATIC ADMINISTRATIVE RESULT      The result for this exam can be found in the Progress note in the chart.          Impression          :   See Progress note in the chart.                    Assessment and Plan:             ICD-10-CM  ICD-9-CM          1.  Lumbar  pain   M54.50  724.2     2.  Lumbar spondylosis   M47.816  721.3          3.  Disc degeneration, lumbar   M51.36  722.52             Orders Placed This Encounter        ?  XR SPINE LUMB 2 OR 3 V              Standing Status:    Future         Number of Occurrences:    1         Standing Expiration Date:    05/18/2021         Order Specific Question:    Is Patient Pregnant?              Answer:    Unknown            4 This is a chronic illness/condition with exacerbation and progression   Treatment at this time: She will attend physical therapy for strengthening, stretching, and a home exercise program.       She does not want medications for pain at this time.      Studies ordered today: none.  If her pain persists/worsens, we will proceed with lumbar spine MRI.  We discussed that possibility today, and  she would like to wait on that for now.         Ferrel Logan, MD   05/17/2020,  9:18 AM

## 2020-08-07 ENCOUNTER — Encounter

## 2020-08-07 NOTE — Telephone Encounter (Signed)
Updated order has been faxed to [facility].

## 2020-08-07 NOTE — Telephone Encounter (Signed)
They need an updated order. Hers expires Friday and they see her Friday. Please fax to # [phone].

## 2020-08-07 NOTE — Progress Notes (Signed)
New order will be sent to [facility].

## 2020-09-12 LAB — HEMOGLOBIN A1C: Hemoglobin A1C, External: 5.6 % (ref 3.2–7.0)

## 2020-09-25 ENCOUNTER — Telehealth

## 2020-09-25 NOTE — Telephone Encounter (Signed)
New order has been faxed to [facility].

## 2020-09-25 NOTE — Telephone Encounter (Signed)
Needs updated PT order faxed to MJ at [facility] fax# [phone]

## 2020-11-28 NOTE — Telephone Encounter (Signed)
Order faxed.

## 2020-11-28 NOTE — Telephone Encounter (Signed)
Lisa Mcdonald @ [facility] is needing a new order [id number], I told her patient had not been seen recently and she said they had [facility] as the ordering doctor??

## 2021-09-10 LAB — HEMOGLOBIN A1C: Hemoglobin A1C, External: 5.7 % (ref 3.2–7.0)

## 2022-11-12 LAB — HEMOGLOBIN A1C
Estimated Avg Glucose, External: 114 mg/dL
Hemoglobin A1C, External: 5.6 % (ref 4.8–5.6)

## 2022-11-19 ENCOUNTER — Encounter

## 2022-12-09 ENCOUNTER — Encounter

## 2022-12-20 ENCOUNTER — Inpatient Hospital Stay: Admit: 2022-12-20 | Payer: MEDICARE | Primary: Student in an Organized Health Care Education/Training Program

## 2022-12-20 DIAGNOSIS — Z1231 Encounter for screening mammogram for malignant neoplasm of breast: Secondary | ICD-10-CM

## 2022-12-23 ENCOUNTER — Ambulatory Visit: Payer: MEDICARE | Primary: Student in an Organized Health Care Education/Training Program

## 2022-12-23 DIAGNOSIS — E049 Nontoxic goiter, unspecified: Secondary | ICD-10-CM

## 2023-01-23 ENCOUNTER — Inpatient Hospital Stay
Admit: 2023-01-23 | Discharge: 2023-01-23 | Disposition: A | Discharge status: Home / Self Care | Payer: PRIVATE HEALTH INSURANCE | Attending: General Practice

## 2023-01-23 ENCOUNTER — Emergency Department
Admit: 2023-01-23 | Payer: PRIVATE HEALTH INSURANCE | Primary: Student in an Organized Health Care Education/Training Program

## 2023-01-23 DIAGNOSIS — N200 Calculus of kidney: Secondary | ICD-10-CM

## 2023-01-23 DIAGNOSIS — N132 Hydronephrosis with renal and ureteral calculous obstruction: Secondary | ICD-10-CM

## 2023-01-23 LAB — COMPREHENSIVE METABOLIC PANEL
ALT: 39 U/L (ref 12–65)
AST: 26 U/L (ref 15–37)
Albumin/Globulin Ratio: 1.2 (ref 1.0–1.9)
Albumin: 3.9 g/dL (ref 3.5–5.0)
Alk Phosphatase: 74 U/L (ref 35–104)
Anion Gap: 19 mmol/L — ABNORMAL HIGH (ref 9–18)
BUN: 14 mg/dL (ref 6–23)
CO2: 18 mmol/L — ABNORMAL LOW (ref 20–28)
Calcium: 9.2 mg/dL (ref 8.8–10.2)
Chloride: 102 mmol/L (ref 98–107)
Creatinine: 0.74 mg/dL (ref 0.60–1.10)
Est, Glom Filt Rate: >90 mL/min/{1.73_m2} (ref >60)
Globulin: 3.2 g/dL (ref 2.3–3.5)
Glucose: 126 mg/dL — ABNORMAL HIGH (ref 70–99)
Potassium: 4 mmol/L (ref 3.5–5.1)
Sodium: 139 mmol/L (ref 136–145)
Total Bilirubin: 0.3 mg/dL (ref 0.0–1.2)
Total Protein: 7.1 g/dL (ref 6.3–8.2)

## 2023-01-23 LAB — CBC WITH AUTO DIFFERENTIAL
Basophils %: 1 % (ref 0.0–2.0)
Basophils Absolute: 0.1 10*3/uL (ref 0.0–0.2)
Eosinophils %: 4 % (ref 0.5–7.8)
Eosinophils Absolute: 0.4 10*3/uL (ref 0.0–0.8)
Hematocrit: 42.1 % (ref 35.8–46.3)
Hemoglobin: 14.3 g/dL (ref 11.7–15.4)
Immature Granulocytes %: 1 % (ref 0.0–5.0)
Immature Granulocytes Absolute: 0.1 10*3/uL (ref 0.0–0.5)
Lymphocytes %: 29 % (ref 13–44)
Lymphocytes Absolute: 2.8 10*3/uL (ref 0.5–4.6)
MCH: 28.4 pg (ref 26.1–32.9)
MCHC: 34 g/dL (ref 31.4–35.0)
MCV: 83.5 FL (ref 82.0–102.0)
MPV: 9.6 FL (ref 9.4–12.3)
Monocytes %: 9 % (ref 4.0–12.0)
Monocytes Absolute: 0.9 10*3/uL (ref 0.1–1.3)
Neutrophils %: 56 % (ref 43–78)
Neutrophils Absolute: 5.3 10*3/uL (ref 1.7–8.2)
Platelets: 377 10*3/uL (ref 150–450)
RBC: 5.04 M/uL (ref 4.05–5.2)
RDW: 11.7 % — ABNORMAL LOW (ref 11.9–14.6)
WBC: 9.5 10*3/uL (ref 4.3–11.1)
nRBC: 0 10*3/uL (ref 0.0–0.2)

## 2023-01-23 LAB — URINALYSIS
Bilirubin, Urine: NEGATIVE
Blood, Urine: NEGATIVE
Glucose, Ur: NEGATIVE mg/dL
Ketones, Urine: NEGATIVE mg/dL
Leukocyte Esterase, Urine: NEGATIVE
Nitrite, Urine: NEGATIVE
Protein, UA: NEGATIVE mg/dL
Specific Gravity, UA: >1.035 — ABNORMAL HIGH (ref 1.001–1.023)
Urobilinogen, Urine: 0.2 EU/dL (ref 0.2–1.0)
pH, Urine: 6.5 (ref 5.0–9.0)

## 2023-01-23 LAB — TROPONIN: Troponin T: <6 ng/L (ref 0–14)

## 2023-01-23 LAB — MAGNESIUM: Magnesium: 2.1 mg/dL (ref 1.8–2.4)

## 2023-01-23 MED ORDER — KETOROLAC TROMETHAMINE 15 MG/ML IJ SOLN
15 | Freq: Once | INTRAMUSCULAR | Status: AC
Start: 2023-01-23 — End: 2023-01-23
  Administered 2023-01-23: 18:00:00 15 mg via INTRAVENOUS

## 2023-01-23 MED ORDER — IBUPROFEN 600 MG PO TABS
600 | ORAL_TABLET | Freq: Four times a day (QID) | ORAL | 0 refills | Status: AC | PRN
Start: 2023-01-23 — End: ?

## 2023-01-23 MED ORDER — ONDANSETRON HCL 4 MG/2ML IJ SOLN
4 | Freq: Once | INTRAMUSCULAR | Status: AC
Start: 2023-01-23 — End: 2023-01-23
  Administered 2023-01-23: 16:00:00 4 mg via INTRAVENOUS

## 2023-01-23 MED ORDER — SODIUM CHLORIDE 0.9 % IV BOLUS
0.9 | Freq: Once | INTRAVENOUS | Status: AC
Start: 2023-01-23 — End: 2023-01-23
  Administered 2023-01-23: 16:00:00 1000 mL via INTRAVENOUS

## 2023-01-23 MED ORDER — HYDROCODONE-ACETAMINOPHEN 5-325 MG PO TABS
5-325 | ORAL_TABLET | Freq: Four times a day (QID) | ORAL | 0 refills | Status: AC | PRN
Start: 2023-01-23 — End: 2023-01-26

## 2023-01-23 MED ORDER — IOPAMIDOL 76 % IV SOLN
76 | Freq: Once | INTRAVENOUS | Status: AC | PRN
Start: 2023-01-23 — End: 2023-01-23
  Administered 2023-01-23: 16:00:00 100 mL via INTRAVENOUS

## 2023-01-23 MED ORDER — TAMSULOSIN HCL 0.4 MG PO CAPS
0.4 | ORAL_CAPSULE | Freq: Every day | ORAL | 0 refills | Status: AC
Start: 2023-01-23 — End: 2023-01-30

## 2023-01-23 MED ORDER — MORPHINE SULFATE (PF) 4 MG/ML IJ SOLN
4 | INTRAMUSCULAR | Status: AC
Start: 2023-01-23 — End: 2023-01-23
  Administered 2023-01-23: 16:00:00 4 mg via INTRAVENOUS

## 2023-01-23 MED FILL — MORPHINE SULFATE 4 MG/ML IJ SOLN: 4 mg/mL | INTRAMUSCULAR | Qty: 1

## 2023-01-23 MED FILL — ONDANSETRON HCL 4 MG/2ML IJ SOLN: 4 MG/2ML | INTRAMUSCULAR | Qty: 2

## 2023-01-23 MED FILL — KETOROLAC TROMETHAMINE 15 MG/ML IJ SOLN: 15 MG/ML | INTRAMUSCULAR | Qty: 1

## 2023-01-23 NOTE — ED Triage Notes (Signed)
Pt presents to ED diaphoretic and tachypneic reporting sharp, tight chest and abdominal pain with dyspnea x66minutes. Pt endorses family hx of MI  Hx ovarian cyst     (+)active emesis in triage    A&Ox4

## 2023-01-23 NOTE — ED Provider Notes (Signed)
Emergency Department Provider Note       PCP: No primary care provider on file.   Age: 52 y.o.   Sex: female     DISPOSITION Decision To Discharge 01/23/2023 03:51:15 PM  Condition at Disposition: Good       ICD-10-CM    1. Kidney stone  N20.0 HYDROcodone-acetaminophen (NORCO) 5-325 MG per tablet     [facility], [facility] Decision Making     Patient presents with signs and symptoms of right kidney stone.  This is obstructing and it is 6 mm.  Patient does not have anything to suggest infection, intractable pain or kidney dysfunction.  Pain is controlled here and she feels much improved.  Patient will be treated with pain medicine and she has to follow-up with urology as an outpatient.  Strict return precautions are given.     1 or more acute illnesses that pose a threat to life or bodily function.   Prescription drug management performed.  Parental controlled substances given in the ED.  Patient was discharged risks and benefits of hospitalization were considered.  Shared medical decision making was utilized in creating the patients health plan today.    I independently ordered and reviewed each unique test.  I reviewed external records: provider visit note from outside specialist.     I interpreted the CT Scan CT scan of the abdomen pelvis shows 6 mm obstructing right distal ureteral stone with hydronephrosis.  I have reviewed and agree with radiology report.  My Independent EKG Interpretation: sinus rhythm, no evidence of arrhythmia      ST Segments:Normal ST segments - NO STEMI   Rate: 71      Exclusion criteria - the patient is NOT to be included for SEP-1 Core Measure due to: Infection is not suspected       History     Patient presents with acute abdominal pain and nausea vomiting.  Symptoms started about 20 minutes prior to arrival.  Patient allegedly came downstairs and started developing pain and then started having significant retching.  No blood in the emesis.   Has apparently had some diarrhea as well no blood.  Has not had symptoms like this before.  She denies any fevers.  Pain goes up in the epigastric area/lower chest.        ROS     Review of Systems   All other systems reviewed and are negative.       Physical Exam     Vitals signs and nursing note reviewed:  Vitals:    01/23/23 1114 01/23/23 1115 01/23/23 1117 01/23/23 1316   BP:    (!) 146/93   Pulse: 63   70   Resp: 19   18   Temp:  97.8 F (36.6 C)     TempSrc:  Oral     SpO2: 94%   94%   Weight:   103.9 kg (229 lb)    Height:   1.626 m (5\' 4" )       Physical Exam  Constitutional:       General: She is not in acute distress.  HENT:      Head: Normocephalic and atraumatic.      Nose: Nose normal.      Mouth/Throat:      Mouth: Mucous membranes are moist.   Eyes:      Extraocular Movements: Extraocular movements intact.  Pupils: Pupils are equal, round, and reactive to light.   Cardiovascular:      Rate and Rhythm: Normal rate and regular rhythm.      Heart sounds: Normal heart sounds.   Pulmonary:      Effort: No respiratory distress.      Breath sounds: No wheezing.   Abdominal:      General: Abdomen is flat.      Palpations: Abdomen is soft.      Tenderness: There is no abdominal tenderness.   Musculoskeletal:         General: No swelling or tenderness.      Cervical back: Normal range of motion.   Skin:     Findings: No erythema or rash.   Neurological:      General: No focal deficit present.      Mental Status: She is oriented to person, place, and time.   Psychiatric:         Mood and Affect: Mood normal.         Behavior: Behavior normal.        Procedures     Procedures    Orders Placed This Encounter   Procedures    CT ABDOMEN PELVIS W IV CONTRAST Additional Contrast? None    CBC with Auto Differential    Comprehensive Metabolic Panel    Magnesium    Troponin "IF" patient is greater than 79 years of age or has a history of cardiac disease.    Urinalysis    [facility],  [facility]    Cardiac Monitor - ED Only    EKG 12 Lead        Medications given during this emergency department visit:  Medications   morphine sulfate (PF) injection 4 mg (4 mg IntraVENous Given 01/23/23 1143)   ondansetron (ZOFRAN) injection 4 mg (4 mg IntraVENous Given 01/23/23 1143)   sodium chloride 0.9 % bolus 1,000 mL (0 mLs IntraVENous Stopped 01/23/23 1211)   iopamidol (ISOVUE-370) 76 % injection 100 mL (100 mLs IntraVENous Given 01/23/23 1223)   ketorolac (TORADOL) injection 15 mg (15 mg IntraVENous Given 01/23/23 1355)       New Prescriptions    HYDROCODONE-ACETAMINOPHEN (NORCO) 5-325 MG PER TABLET    Take 1 tablet by mouth every 6 hours as needed for Pain for up to 3 days. Intended supply: 3 days. Take lowest dose possible to manage pain Max Daily Amount: 4 tablets    IBUPROFEN (ADVIL;MOTRIN) 600 MG TABLET    Take 1 tablet by mouth 4 times daily as needed for Pain    TAMSULOSIN (FLOMAX) 0.4 MG CAPSULE    Take 1 capsule by mouth daily for 7 days        No past medical history on file.     No past surgical history on file.     Social History     Socioeconomic History    Marital status: Married        Previous Medications    No medications on file        Results for orders placed or performed during the hospital encounter of 01/23/23   CT ABDOMEN PELVIS W IV CONTRAST Additional Contrast? None    Narrative    CT OF THE ABDOMEN AND PELVIS    INDICATION: Chest pain    TECHNIQUE: Multiple 2D axial images were obtained through the abdomen and  pelvis.  Oral contrast was used for bowel opacification.  of Isovue 370  intravenous contrast was used for better evaluation of solid organs and vascular  structures.  Radiation dose reduction techniques were used for this study.  All  CT scans performed at this facility use one or all of the following: Automated  exposure control, adjustment of the mA and/or kVp according to patient's size,  iterative reconstruction.    COMPARISON: None    FINDINGS:  - LUNG BASES: No  infiltrates or masses.    - LIVER: Hepatomegaly. No hepatic lesions.  - GALLBLADDER/BILE DUCTS: No gallstones or bile duct dilatation.  - PANCREAS: Normal.  - SPLEEN: Normal.    - ADRENALS: Normal.  - KIDNEYS/URETERS: Obstructing right distal ureteral 6 mm stone with moderate  associated hydroureteronephrosis. Unremarkable left kidney.    BLADDER: Normal.  - REPRODUCTIVE ORGANS: No pelvic masses.    - BOWEL: Mild distention with air-fluid levels in the small and large bowel.  - LYMPH NODES: No significant retroperitoneal, mesenteric, or pelvic adenopathy.  - BONES: No fracture or significant bone lesion.  - VASCULATURE: Normal  - OTHER: No ascites.      Impression    Obstructing right distal ureteral 6 mm stone with moderate  associated hydroureteronephrosis.    Mildly distended small and large bowel with air-fluid levels, suggesting  enteritis and or ileus.    Hepatomegaly.          Electronically signed by JEFFREY N MASI   CBC with Auto Differential   Result Value Ref Range    WBC 9.5 4.3 - 11.1 K/uL    RBC 5.04 4.05 - 5.2 M/uL    Hemoglobin 14.3 11.7 - 15.4 g/dL    Hematocrit 47.8 29.5 - 46.3 %    MCV 83.5 82.0 - 102.0 FL    MCH 28.4 26.1 - 32.9 PG    MCHC 34.0 31.4 - 35.0 g/dL    RDW 62.1 (L) 30.8 - 14.6 %    Platelets 377 150 - 450 K/uL    MPV 9.6 9.4 - 12.3 FL    nRBC 0.00 0.0 - 0.2 K/uL    Differential Type AUTOMATED      Neutrophils % 56 43 - 78 %    Lymphocytes % 29 13 - 44 %    Monocytes % 9 4.0 - 12.0 %    Eosinophils % 4 0.5 - 7.8 %    Basophils % 1 0.0 - 2.0 %    Immature Granulocytes % 1 0.0 - 5.0 %    Neutrophils Absolute 5.3 1.7 - 8.2 K/UL    Lymphocytes Absolute 2.8 0.5 - 4.6 K/UL    Monocytes Absolute 0.9 0.1 - 1.3 K/UL    Eosinophils Absolute 0.4 0.0 - 0.8 K/UL    Basophils Absolute 0.1 0.0 - 0.2 K/UL    Immature Granulocytes Absolute 0.1 0.0 - 0.5 K/UL   Comprehensive Metabolic Panel   Result Value Ref Range    Sodium 139 136 - 145 mmol/L    Potassium 4.0 3.5 - 5.1 mmol/L    Chloride 102 98 - 107  mmol/L    CO2 18 (L) 20 - 28 mmol/L    Anion Gap 19 (H) 9 - 18 mmol/L    Glucose 126 (H) 70 - 99 mg/dL    BUN 14 6 - 23 MG/DL    Creatinine 6.57 8.46 - 1.10 MG/DL    Est, Glom Filt Rate >90 >60 ml/min/1.49m2    Calcium 9.2 8.8 - 10.2 MG/DL    Total Bilirubin 0.3 0.0 - 1.2 MG/DL  ALT 39 12 - 65 U/L    AST 26 15 - 37 U/L    Alk Phosphatase 74 35 - 104 U/L    Total Protein 7.1 6.3 - 8.2 g/dL    Albumin 3.9 3.5 - 5.0 g/dL    Globulin 3.2 2.3 - 3.5 g/dL    Albumin/Globulin Ratio 1.2 1.0 - 1.9     Magnesium   Result Value Ref Range    Magnesium 2.1 1.8 - 2.4 mg/dL   Troponin "IF" patient is greater than 22 years of age or has a history of cardiac disease.   Result Value Ref Range    Troponin T <6.0 0 - 14 ng/L   Urinalysis   Result Value Ref Range    Color, UA YELLOW/STRAW      Appearance CLEAR      Specific Gravity, UA >1.035 (H) 1.001 - 1.023    pH, Urine 6.5 5.0 - 9.0      Protein, UA Negative NEG mg/dL    Glucose, Ur Negative mg/dL    Ketones, Urine Negative NEG mg/dL    Bilirubin, Urine Negative NEG      Blood, Urine Negative NEG      Urobilinogen, Urine 0.2 0.2 - 1.0 EU/dL    Nitrite, Urine Negative NEG      Leukocyte Esterase, Urine Negative NEG           CT ABDOMEN PELVIS W IV CONTRAST Additional Contrast? None   Final Result   Obstructing right distal ureteral 6 mm stone with moderate   associated hydroureteronephrosis.      Mildly distended small and large bowel with air-fluid levels, suggesting   enteritis and or ileus.      Hepatomegaly.               Electronically signed by JEFFREY N MASI                   No results for input(s): "COVID19" in the last 72 hours.    Voice dictation software was used during the making of this note.  This software is not perfect and grammatical and other typographical errors may be present.  This note has not been completely proofread for errors.     Sandi Raveling, DO  01/23/23 1554

## 2023-02-25 ENCOUNTER — Encounter
Payer: PRIVATE HEALTH INSURANCE | Attending: Urology | Primary: Student in an Organized Health Care Education/Training Program
